# Patient Record
Sex: Male | Born: 1975 | Race: Black or African American | Hispanic: No | Marital: Married | Smoking: Current every day smoker
Health system: Southern US, Community
[De-identification: ages and names within clinical notes are randomized; demographics above are authoritative.]

---

## 2015-04-13 ENCOUNTER — Emergency Department: Payer: Self-pay

## 2015-04-13 ENCOUNTER — Emergency Department
Admission: EM | Admit: 2015-04-13 | Discharge: 2015-04-13 | Disposition: A | Discharge status: Home / Self Care | Payer: Self-pay | Attending: Emergency Medicine | Admitting: Emergency Medicine

## 2015-04-13 DIAGNOSIS — R911 Solitary pulmonary nodule: Secondary | ICD-10-CM | POA: Insufficient documentation

## 2015-04-13 DIAGNOSIS — M7918 Myalgia, other site: Secondary | ICD-10-CM

## 2015-04-13 DIAGNOSIS — M791 Myalgia: Secondary | ICD-10-CM | POA: Insufficient documentation

## 2015-04-13 DIAGNOSIS — R0789 Other chest pain: Secondary | ICD-10-CM | POA: Insufficient documentation

## 2015-04-13 DIAGNOSIS — Z720 Tobacco use: Secondary | ICD-10-CM | POA: Insufficient documentation

## 2015-04-13 MED ORDER — HYDROCODONE-ACETAMINOPHEN 5-325 MG PO TABS
ORAL_TABLET | ORAL | Status: AC
  Filled 2015-04-13: qty 1

## 2015-04-13 MED ORDER — IPRATROPIUM-ALBUTEROL 0.5-2.5 (3) MG/3ML IN SOLN
RESPIRATORY_TRACT | Status: AC
  Filled 2015-04-13: qty 3

## 2015-04-13 MED ORDER — IPRATROPIUM-ALBUTEROL 0.5-2.5 (3) MG/3ML IN SOLN
3.0000 mL | Freq: Once | RESPIRATORY_TRACT | Status: AC
  Administered 2015-04-13: 3 mL via RESPIRATORY_TRACT

## 2015-04-13 MED ORDER — AZITHROMYCIN 250 MG PO TABS: ORAL_TABLET | ORAL | Status: DC

## 2015-04-13 MED ORDER — HYDROCODONE-ACETAMINOPHEN 5-325 MG PO TABS
1.0000 | ORAL_TABLET | ORAL | Status: DC | PRN
Start: 2015-04-13 — End: 2021-03-19

## 2015-04-13 MED ORDER — ALBUTEROL SULFATE HFA 108 (90 BASE) MCG/ACT IN AERS: 2.0000 | INHALATION_SPRAY | Freq: Four times a day (QID) | RESPIRATORY_TRACT | Status: DC | PRN

## 2015-04-13 MED ORDER — HYDROCODONE-ACETAMINOPHEN 5-325 MG PO TABS
2.0000 | ORAL_TABLET | Freq: Once | ORAL | Status: AC
  Administered 2015-04-13: 2 via ORAL

## 2015-04-13 NOTE — ED Notes (Addendum)
Pt states that he has been having pain in his back on the right side x 2 days. Pt points to right rib cage area but states that pain is in his back. Pt states that he is a smoker and denies cough, congestion. Pt states he can't take a deep breath. Pt has taken some motrin for pain but it helped with pain but still had difficulty breathing. Pt states he may have contracted "some small town virus or pneumonia."

## 2015-04-13 NOTE — ED Provider Notes (Signed)
[facility] Emergency Department Provider Note  ____________________________________________  Time seen:1534 I have reviewed the triage vital signs and the nursing notes.   HISTORY  Chief Complaint Back Pain and Shortness of Breath   HPI Juan Mckenzie is a 39 y.o. male patient is here today with complaint of right-sided back pain. He rates to his right rib cage when he is explaining his pain. He states he is also short of breath due to the pain. This is been going on for 2 days with nonproductive cough at times. He felt like he had flulike symptoms the first of the week and took an Advil and a hot shower which made him feel somewhat better. He denies any fever,  Cough or congestion. States he is a smoker at one pack per day. Denies any asthma or bronchitis in the past. Does not have a PCP is not on any medicines currently. Currently he rates his pain as 10 out of 10   History reviewed. No pertinent past medical history.  There are no active problems to display for this patient.   History reviewed. No pertinent past surgical history.  No current outpatient prescriptions on file.  Allergies Review of patient's allergies indicates no known allergies.  No family history on file.  Social History History  Substance Use Topics  . Smoking status: Current Every Day Smoker -- 1.00 packs/day    Types: Cigarettes  . Smokeless tobacco: Not on file  . Alcohol Use: No    Review of Systems Constitutional: No fever/chills Eyes: No visual changes. ENT: No sore throat. Cardiovascular: Positive posterior chest pain. Respiratory: - Positive shortness of breath. Gastrointestinal: No abdominal pain.  No nausea, no vomiting.  No diarrhea. . Genitourinary: Negative for dysuria. Musculoskeletal: Negative for low back pain. Tender right rhomboid area and scapular area. Skin: Negative for rash. Neurological: Negative for headaches, focal weakness or numbness.  10-point  ROS otherwise negative.  ____________________________________________   PHYSICAL EXAM:  VITAL SIGNS: ED Triage Vitals  Enc Vitals Group     BP 04/13/15 1454 129/88 mmHg     Pulse Rate 04/13/15 1454 85     Resp 04/13/15 1454 18     Temp 04/13/15 1454 98.2 F (36.8 C)     Temp Source 04/13/15 1454 Oral     SpO2 04/13/15 1454 96 %     Weight 04/13/15 1454 210 lb (95.255 kg)     Height 04/13/15 1454  (1.854 m)     Head Cir --      Peak Flow --      Pain Score 04/13/15 1454 10     Pain Loc --      Pain Edu? --      Excl. in GC? --     Constitutional: Alert and oriented. Well appearing and in no acute distress but does appear uncomfortable lying on his stomach and with guarded movements.. Eyes: Conjunctivae are normal. PERRL. EOMI. Head: Atraumatic. Nose: No congestion/rhinnorhea. Mouth/Throat: Mucous membranes are moist.  Oropharynx non-erythematous. Neck: No stridor.   Cardiovascular: Normal rate, regular rhythm. Grossly normal heart sounds.  Good peripheral circulation. Respiratory: Normal respiratory effort.  No retractions. Lungs CTAB. Poor air exchange bilaterally and guarded secondary to rib pain. Moderate tenderness on palpation of the rhomboid muscle right side. Range of motion is restricted secondary to pain Gastrointestinal: Soft and nontender. No distention. Musculoskeletal: No lower extremity tenderness nor edema.  No joint effusions. Neurologic:  Normal speech and language. No gross focal  neurologic deficits are appreciated. Speech is normal. No gait instability. Skin:  Skin is warm, dry and intact. No rash noted. Psychiatric: Mood and affect are normal. Speech and behavior are normal.  ____________________________________________   LABS (all labs ordered are listed, but only abnormal results are displayed)  Labs Reviewed - No data to  display ____________________________________________  EKG  Deferred ____________________________________________  RADIOLOGY  Nodular density noted in the left midlung. 5-10 mm in size. CT without contrast would be helpful to evaluate for neoplasm  CT scan per radiologist's shows a 12.5 mm superior segment left lower lobe nodule with some spiculation.  Consider inflammatory process versus neoplasm. Recommended for patient to have repeat CT in 1 month. __________________________ __________________   PROCEDURES  Procedure(s) performed: None  Critical Care performed: No  ____________________________________________   INITIAL IMPRESSION / ASSESSMENT AND PLAN / ED COURSE  Pertinent labs & imaging results that were available during my care of the patient were reviewed by me and considered in my medical decision making (see chart for details).  Patient was told the results of his CT scan and the need for repeat CT scan in 1 month. He does not have a PCP so that doctor on call for medicine was given to him. He was instructed to call Monday and go ahead and make an appointment. He is also instructed to quit smoking at this time after 20 years of smoking. ____________________________________________   FINAL CLINICAL IMPRESSION(S) / ED DIAGNOSES  Final diagnoses:  Myofacial muscle pain  Acute chest wall pain  Incidental lung nodule      Tommi RumpsRhonda L Summers, PA-C 04/13/15 1901  Minna AntisKevin Paduchowski, MD 04/13/15 [phone]

## 2015-04-13 NOTE — Discharge Instructions (Signed)
Chest Pain (Nonspecific) It is often hard to give a diagnosis for the cause of chest pain. There is always a chance that your pain could be related to something serious, such as a heart attack or a blood clot in the lungs. You need to follow up with your doctor. HOME CARE  If antibiotic medicine was given, take it as directed by your doctor. Finish the medicine even if you start to feel better.  For the next few days, avoid activities that bring on chest pain. Continue physical activities as told by your doctor.  Do not use any tobacco products. This includes cigarettes, chewing tobacco, and e-cigarettes.  Avoid drinking alcohol.  Only take medicine as told by your doctor.  Follow your doctor's suggestions for more testing if your chest pain does not go away.  Keep all doctor visits you made. GET HELP IF:  Your chest pain does not go away, even after treatment.  You have a rash with blisters on your chest.  You have a fever. GET HELP RIGHT AWAY IF:   You have more pain or pain that spreads to your arm, neck, jaw, back, or belly (abdomen).  You have shortness of breath.  You cough more than usual or cough up blood.  You have very bad back or belly pain.  You feel sick to your stomach (nauseous) or throw up (vomit).  You have very bad weakness.  You pass out (faint).  You have chills. This is an emergency. Do not wait to see if the problems will go away. Call your local emergency services (911 in [place] Do not drive yourself to the hospital. MAKE SURE YOU:   Understand these instructions.  Will watch your condition.  Will get help right away if you are not doing well or get worse. Document Released: 04/14/2008 Document Revised: 11/01/2013 Document Reviewed: 04/14/2008 [facility] Patient Information 2015 [place] [place]. This information is not intended to replace advice given to you by your health care provider. Make sure you discuss any questions you have with your  health care provider.     YOU WILL NEED TO STOP SMOKING!    CALL THE DOCTOR ON YOUR PAPERS ON Monday AND TELL THEM THAT YOU WERE SEEN IN THE ER. TAKE Z PACK AS DIRECTED, BEGIN USING INHALER 4 TIMES  A DAY  RETURN TO ER IF ANY SEVERE WORSENING OR URGENT CONCERNS

## 2015-04-13 NOTE — ED Notes (Signed)
Pt c/o upper back pain, worse with deep breathing and movement, "feels like I cant get a full breaths" for the past 2 days.Marland Kitchen.denies any recent illness.the patient is in NAD at present , respirations WNL

## 2016-09-27 ENCOUNTER — Other Ambulatory Visit
Admit: 2016-09-27 | Discharge: 2016-09-27 | Disposition: A | Discharge status: Home / Self Care | Attending: Family Medicine | Admitting: Family Medicine

## 2016-09-27 NOTE — ED Notes (Signed)
Pt brought to ED by [facility] officer  [place]  for blood draw. Pt blood drawn by this RN from the right ac x 1 attempt at 0228 am after cleansing area with provided cleaning pad from blood draw kit. Blood given to [facility] officer [place]  per chain of custody protocol.

## 2016-11-10 ENCOUNTER — Encounter: Payer: Self-pay | Admitting: Emergency Medicine

## 2016-11-10 ENCOUNTER — Emergency Department
Admission: EM | Admit: 2016-11-10 | Discharge: 2016-11-10 | Disposition: A | Discharge status: Home / Self Care | Attending: Emergency Medicine | Admitting: Emergency Medicine

## 2016-11-10 DIAGNOSIS — Z79899 Other long term (current) drug therapy: Secondary | ICD-10-CM | POA: Insufficient documentation

## 2016-11-10 DIAGNOSIS — K0381 Cracked tooth: Secondary | ICD-10-CM | POA: Insufficient documentation

## 2016-11-10 DIAGNOSIS — H9201 Otalgia, right ear: Secondary | ICD-10-CM | POA: Insufficient documentation

## 2016-11-10 DIAGNOSIS — F17210 Nicotine dependence, cigarettes, uncomplicated: Secondary | ICD-10-CM | POA: Insufficient documentation

## 2016-11-10 DIAGNOSIS — K047 Periapical abscess without sinus: Secondary | ICD-10-CM

## 2016-11-10 DIAGNOSIS — F1290 Cannabis use, unspecified, uncomplicated: Secondary | ICD-10-CM | POA: Insufficient documentation

## 2016-11-10 MED ORDER — TRAMADOL HCL 50 MG PO TABS: 50.0000 mg | ORAL_TABLET | Freq: Four times a day (QID) | ORAL | 0 refills | Status: AC | PRN

## 2016-11-10 MED ORDER — LIDOCAINE VISCOUS 2 % MT SOLN
15.0000 mL | Freq: Once | OROMUCOSAL | Status: AC
  Administered 2016-11-10: 15 mL via OROMUCOSAL
  Filled 2016-11-10: qty 15

## 2016-11-10 MED ORDER — IBUPROFEN 600 MG PO TABS: 600.0000 mg | ORAL_TABLET | Freq: Three times a day (TID) | ORAL | 0 refills | Status: DC | PRN

## 2016-11-10 MED ORDER — IBUPROFEN 800 MG PO TABS
800.0000 mg | ORAL_TABLET | Freq: Once | ORAL | Status: AC
  Administered 2016-11-10: 800 mg via ORAL
  Filled 2016-11-10: qty 1

## 2016-11-10 MED ORDER — AMOXICILLIN 500 MG PO CAPS: 500.0000 mg | ORAL_CAPSULE | Freq: Three times a day (TID) | ORAL | 0 refills | Status: DC

## 2016-11-10 MED ORDER — LIDOCAINE VISCOUS 2 % MT SOLN: 5.0000 mL | Freq: Four times a day (QID) | OROMUCOSAL | 0 refills | Status: DC | PRN

## 2016-11-10 NOTE — ED Notes (Signed)
Pt to ed with c/o right sided facial pain and swelling x 2 days.  Pt with c/o +toothache, headache, right sided earache.  Denies difficulty swallowing, denies difficulty breathing.

## 2016-11-10 NOTE — Discharge Instructions (Signed)
Follow-up for nystatin to close this provided: OPTIONS FOR DENTAL FOLLOW UP CARE  [place] Department of Health and Human Services - Local Safety Net Dental Clinics TripDoors.comhttp://www.ncdhhs.gov/dph/oralhealth/services/safetynetclinics.htm   [facility] [phone])  [facility] [phone])  [facility] [phone]  [facility] [phone])  [facility] [phone]) This clinic caters to the indigent population and is on a lottery system. Location: [employer] [address], [address], [place] [facility] Hours: Wednesdays from 6pm - 9pm, patients seen by a lottery system. For dates, call or go to [email or web address] Services: Cleanings, fillings and simple extractions. Payment Options: DENTAL WORK IS FREE OF CHARGE. Bring proof of income or support. Best way to get seen: Arrive at 5:15 pm - this is a lottery, NOT first come/first serve, so arriving earlier will not increase your chances of being seen.     [facility] [phone] Select option 1 for emergencies   Location: [facility], [facility], [address], [place] [facility] Hours: No walk-ins accepted - call the day before to schedule an appointment. Check in times are 9:30 am and 1:30 pm. Services: Simple extractions, temporary fillings, pulpectomy/pulp debridement, uncomplicated abscess drainage. Payment Options: PAYMENT IS DUE AT THE TIME OF SERVICE.  Fee is usually $100-200, additional surgical procedures (e.g. abscess drainage) may be extra. Cash, checks, Visa/MasterCard accepted.  Can file Medicaid if patient is covered for dental - patient should call case worker to check. No discount for [facility] patients. Best way to get seen: MUST call the day before and get onto the schedule. Can usually be seen the next 1-2 days. No walk-ins accepted.      [facility] [phone]   Location: [facility], [address], [place] [facility] Hours: M, W, Th, F 8am or 1:30pm, Tues 9a or 1:30 - first come/first served. Services: Simple extractions, temporary fillings, uncomplicated abscess drainage.  You do not need to be an [facility] [place] [facility] resident. Payment Options: PAYMENT IS DUE AT THE TIME OF SERVICE. Dental insurance, otherwise sliding scale - bring proof of income or support. Depending on income and treatment needed, cost is usually $50-200. Best way to get seen: Arrive early as it is first come/first served.     [facility] [phone]   Location: [address] Clinic Hours: Mon-Thu 8a-5p Services: Most basic dental services including extractions and fillings. Payment Options: PAYMENT IS DUE AT THE TIME OF SERVICE. Sliding scale, up to 50% off - bring proof if income or support. Medicaid with dental option accepted. Best way to get seen: Call to schedule an appointment, can usually be seen within 2 weeks OR they will try to see walk-ins - show up at 8a or 2p (you may have to wait).     [facility] [phone] [place] RESIDENTS ONLY   Location: [facility], [address], [place], [place] [address] Clinic Hours: By appointment only. Monday - Thursday 8am-5pm, Friday 8am-12pm Services: Cleanings, fillings, extractions. Payment Options: PAYMENT IS DUE AT THE TIME OF SERVICE. Cash, Visa or MasterCard. Sliding scale - $30 minimum per service. Best way to get seen: Come in to office, complete packet and make an appointment - need proof of income or support monies for each household member and proof of [facility] [place] [facility] [place] residence. Usually takes about a month to get in.     [facility]  [phone] °  °Location: °[address] [place] °Clinic Hours: Walk-in Urgent Care  Dental Services are offered Monday-Friday mornings only. °The numbers of emergencies accepted daily is limited to the number of °providers available. °Maximum 15 - Mondays, Wednesdays & Thursdays °Maximum 10 - Tuesdays & Fridays °Services: °You do not need to be a [place] resident to be seen for a dental emergency. °Emergencies are defined as pain, swelling, abnormal bleeding, or dental trauma. Walkins will receive x-rays if needed. °NOTE: Dental cleaning is not an emergency. °Payment Options: °PAYMENT IS DUE AT THE TIME OF SERVICE. °Minimum co-pay is $40.00 for uninsured patients. °Minimum co-pay is $3.00 for Medicaid with dental coverage. °Dental Insurance is accepted and must be presented at time of visit. °Medicare does not cover dental. °Forms of payment: Cash, credit card, checks. °Best way to get seen: °If not previously registered with the clinic, walk-in dental registration begins at 7:15 am and is on a first come/first serve basis. °If previously registered with the clinic, call to make an appointment. °  °  °[facility] °[phone] °[place] RESIDENTS ONLY °  °Location: °[address], [place], [place] °Clinic Hours: °Mon-Thu 10a-2p °Services: Extractions only! °Payment Options: °FREE (donations accepted) - bring proof of income or support °Best way to get seen: °Call and schedule an appointment OR come at 8am on the 1st Monday of every month (except for holidays) when it is first come/first served. °  °  °[facility] °[phone] °  °Location: °[address],  °Clinic Hours: °Friday mornings °Services, Payment Options, Best way to get seen: °Call for info ° °

## 2016-11-10 NOTE — ED Notes (Signed)
Pt ambulatory into lobby. Pt alert and oriented X4, active, cooperative, pt in NAD. RR even and unlabored, color WNL.

## 2016-11-10 NOTE — ED Provider Notes (Signed)
[facility] Emergency Department Provider Note   ____________________________________________   First MD Initiated Contact with Patient 11/10/16 [phone]     (approximate)  I have reviewed the triage vital signs and the nursing notes.   HISTORY  Chief Complaint Dental Pain    HPI Juan Mckenzie is a 41 y.o. male dental and ear pain for 3 days. Patient stated there is some mild edema to the right lateral maxillary area. Patient denies any fever associated this complaint. Patient rates his pain as a 10 over 10. Patient described a pain as "achy". No palliative measures taken for this complaint. History reviewed. No pertinent past medical history.  There are no active problems to display for this patient.   History reviewed. No pertinent surgical history.  Prior to Admission medications   Medication Sig Start Date End Date Taking? Authorizing Provider  albuterol (PROVENTIL HFA;VENTOLIN HFA) 108 (90 BASE) MCG/ACT inhaler Inhale 2 puffs into the lungs every 6 (six) hours as needed for wheezing or shortness of breath. 04/13/15   Tommi Rumps, PA-C  amoxicillin (AMOXIL) 500 MG capsule Take 1 capsule (500 mg total) by mouth 3 (three) times daily. 11/10/16   Joni Reining, PA-C  azithromycin (ZITHROMAX Z-PAK) 250 MG tablet Take 2 tablets (500 mg) on  Day 1,  followed by 1 tablet (250 mg) once daily on Days 2 through 5. 04/13/15   Tommi Rumps, PA-C  HYDROcodone-acetaminophen (NORCO/VICODIN) 5-325 MG per tablet Take 1 tablet by mouth every 4 (four) hours as needed for moderate pain. 04/13/15   Tommi Rumps, PA-C  ibuprofen (ADVIL,MOTRIN) 600 MG tablet Take 1 tablet (600 mg total) by mouth every 8 (eight) hours as needed. 11/10/16   Joni Reining, PA-C  lidocaine (XYLOCAINE) 2 % solution Use as directed 5 mLs in the mouth or throat every 6 (six) hours as needed for mouth pain. 11/10/16   Joni Reining, PA-C  traMADol (ULTRAM) 50 MG tablet Take 1 tablet (50 mg total) by  mouth every 6 (six) hours as needed. 11/10/16 11/10/17  Joni Reining, PA-C    Allergies Patient has no known allergies.  No family history on file.  Social History Social History  Substance Use Topics  . Smoking status: Current Every Day Smoker    Packs/day: 1.00    Types: Cigarettes  . Smokeless tobacco: Never Used  . Alcohol use No    Review of Systems Constitutional: No fever/chills Eyes: No visual changes. RUE:AVWUJ ear and dental pain Cardiovascular: Denies chest pain. Respiratory: Denies shortness of breath. Gastrointestinal: No abdominal pain.  No nausea, no vomiting.  No diarrhea.  No constipation. Genitourinary: Negative for dysuria. Musculoskeletal: Negative for back pain. Skin: Negative for rash. Neurological: Negative for headaches, focal weakness or numbness.    ____________________________________________   PHYSICAL EXAM:  VITAL SIGNS: ED Triage Vitals   Enc Vitals Group     BP (!) 146/90     Pulse Rate 65     Resp 18     Temp 97.4 F (36.3 C)     Temp src      SpO2 99 %     Weight 205 lb (93 kg)     Height 6\' 1"  (1.854 m)     Head Circumference      Peak Flow      Pain Score 10     Pain Loc      Pain Edu?      Excl. in GC?  Constitutional: Alert and oriented. Well appearing and in no acute distress. Eyes: Conjunctivae are normal. PERRL. EOMI. Head: Atraumatic. Nose: No congestion/rhinnorhea. Mouth/Throat: Mucous membranes are moist.  Oropharynx non-erythematous. Fractured teeth #14. Edematous gingiva. Neck: No stridor.  No cervical spine tenderness to palpation. Hematological/Lymphatic/Immunilogical: No cervical lymphadenopathy. Cardiovascular: Normal rate, regular rhythm. Grossly normal heart sounds.  Good peripheral circulation. Respiratory: Normal respiratory effort.  No retractions. Lungs CTAB. Gastrointestinal: Soft and nontender. No distention. No abdominal bruits. No CVA tenderness. Musculoskeletal: No lower extremity  tenderness nor edema.  No joint effusions. Neurologic:  Normal speech and language. No gross focal neurologic deficits are appreciated. No gait instability. Skin:  Skin is warm, dry and intact. No rash noted. Psychiatric: Mood and affect are normal. Speech and behavior are normal.  ____________________________________________   LABS (all labs ordered are listed, but only abnormal results are displayed)  Labs Reviewed - No data to display ____________________________________________  EKG   ____________________________________________  RADIOLOGY   ____________________________________________   PROCEDURES  Procedure(s) performed: None  Procedures  Critical Care performed: No  ____________________________________________   INITIAL IMPRESSION / ASSESSMENT AND PLAN / ED COURSE  Pertinent labs & imaging results that were available during my care of the patient were reviewed by me and considered in my medical decision making (see chart for details).  Dental pain secondary to abscess and fractured tooth. Patient given discharge Instructions. Patient advised to follow-up with the dentist list provided. Patient given a prescription for amoxicillin, ibuprofen, tramadol, and viscous lidocaine.  Clinical Course      ____________________________________________   FINAL CLINICAL IMPRESSION(S) / ED DIAGNOSES  Final diagnoses:  Dental abscess      NEW MEDICATIONS STARTED DURING THIS VISIT:  New Prescriptions   AMOXICILLIN (AMOXIL) 500 MG CAPSULE    Take 1 capsule (500 mg total) by mouth 3 (three) times daily.   IBUPROFEN (ADVIL,MOTRIN) 600 MG TABLET    Take 1 tablet (600 mg total) by mouth every 8 (eight) hours as needed.   LIDOCAINE (XYLOCAINE) 2 % SOLUTION    Use as directed 5 mLs in the mouth or throat every 6 (six) hours as needed for mouth pain.   TRAMADOL (ULTRAM) 50 MG TABLET    Take 1 tablet (50 mg total) by mouth every 6 (six) hours as needed.     Note:  This  document was prepared using Dragon voice recognition software and may include unintentional dictation errors.    Joni Reiningonald K Sharlie Shreffler, PA-C 11/10/16 [id number]    Nita Sicklearolina Veronese, MD 11/11/16 [phone]

## 2016-11-10 NOTE — ED Triage Notes (Signed)
Right side toothache and earache.  No resp distress.

## 2017-02-16 ENCOUNTER — Emergency Department: Payer: Self-pay

## 2017-02-16 ENCOUNTER — Emergency Department
Admission: EM | Admit: 2017-02-16 | Discharge: 2017-02-16 | Disposition: A | Discharge status: Home / Self Care | Payer: Self-pay | Attending: Emergency Medicine | Admitting: Emergency Medicine

## 2017-02-16 ENCOUNTER — Encounter: Payer: Self-pay | Admitting: Emergency Medicine

## 2017-02-16 DIAGNOSIS — X500XXA Overexertion from strenuous movement or load, initial encounter: Secondary | ICD-10-CM | POA: Insufficient documentation

## 2017-02-16 DIAGNOSIS — Y999 Unspecified external cause status: Secondary | ICD-10-CM | POA: Insufficient documentation

## 2017-02-16 DIAGNOSIS — S46912A Strain of unspecified muscle, fascia and tendon at shoulder and upper arm level, left arm, initial encounter: Secondary | ICD-10-CM

## 2017-02-16 DIAGNOSIS — Y939 Activity, unspecified: Secondary | ICD-10-CM | POA: Insufficient documentation

## 2017-02-16 DIAGNOSIS — Y929 Unspecified place or not applicable: Secondary | ICD-10-CM | POA: Insufficient documentation

## 2017-02-16 DIAGNOSIS — S161XXA Strain of muscle, fascia and tendon at neck level, initial encounter: Secondary | ICD-10-CM

## 2017-02-16 DIAGNOSIS — F17210 Nicotine dependence, cigarettes, uncomplicated: Secondary | ICD-10-CM | POA: Insufficient documentation

## 2017-02-16 MED ORDER — METHOCARBAMOL 500 MG PO TABS: 500.0000 mg | ORAL_TABLET | Freq: Four times a day (QID) | ORAL | 0 refills | Status: DC

## 2017-02-16 MED ORDER — MELOXICAM 15 MG PO TABS: 15.0000 mg | ORAL_TABLET | Freq: Every day | ORAL | 0 refills | Status: DC

## 2017-02-16 NOTE — ED Notes (Signed)
Pt discharged to home.  Family member driving.  Discharge instructions reviewed.  Verbalized understanding.  No questions or concerns at this time.  Teach back verified.  Pt in NAD.  No items left in ED.   

## 2017-02-16 NOTE — ED Notes (Signed)
See triage note. Having pain to left shoulder   States he lifted a heavy chair with 1 arm.. No deformity noted   Positive pulses

## 2017-02-16 NOTE — ED Provider Notes (Signed)
[facility] Emergency Department Provider Note  ____________________________________________  Time seen: Approximately 8:46 PM  I have reviewed the triage vital signs and the nursing notes.   HISTORY  Chief Complaint Shoulder Pain and Neck Pain    HPI Juan Mckenzie is a 41 y.o. male who presents emergency department complaining of left-sided neck and left shoulder pain. Patient reports that he does heavy lifting for strong and feels like he "injured something." Patient denies any direct trauma. He reports that "I think, it is. My shoulder." When asked about specific injury, again patient denies any specific injury. Patient reports that he has full range of motion to her shoulder but it hurts to move. Patient reports that it is a burning/tight/throbbing sensation to the left side and neck as well as left shoulder. Patient reports the pain is more located left anterior aspect of the neck and shoulder. No numbness or tingling bilateral upper extremities. No other complaints at this time.   History reviewed. No pertinent past medical history.  There are no active problems to display for this patient.   History reviewed. No pertinent surgical history.  Prior to Admission medications   Medication Sig Start Date End Date Taking? Authorizing Provider  albuterol (PROVENTIL HFA;VENTOLIN HFA) 108 (90 BASE) MCG/ACT inhaler Inhale 2 puffs into the lungs every 6 (six) hours as needed for wheezing or shortness of breath. 04/13/15   Tommi Rumps, PA-C  amoxicillin (AMOXIL) 500 MG capsule Take 1 capsule (500 mg total) by mouth 3 (three) times daily. 11/10/16   Joni Reining, PA-C  azithromycin (ZITHROMAX Z-PAK) 250 MG tablet Take 2 tablets (500 mg) on  Day 1,  followed by 1 tablet (250 mg) once daily on Days 2 through 5. 04/13/15   Tommi Rumps, PA-C  HYDROcodone-acetaminophen (NORCO/VICODIN) 5-325 MG per tablet Take 1 tablet by mouth every 4 (four) hours as needed for  moderate pain. 04/13/15   Tommi Rumps, PA-C  ibuprofen (ADVIL,MOTRIN) 600 MG tablet Take 1 tablet (600 mg total) by mouth every 8 (eight) hours as needed. 11/10/16   Joni Reining, PA-C  lidocaine (XYLOCAINE) 2 % solution Use as directed 5 mLs in the mouth or throat every 6 (six) hours as needed for mouth pain. 11/10/16   Joni Reining, PA-C  meloxicam (MOBIC) 15 MG tablet Take 1 tablet (15 mg total) by mouth daily. 02/16/17   Delorise Royals Cuthriell, PA-C  methocarbamol (ROBAXIN) 500 MG tablet Take 1 tablet (500 mg total) by mouth 4 (four) times daily. 02/16/17   Delorise Royals Cuthriell, PA-C  traMADol (ULTRAM) 50 MG tablet Take 1 tablet (50 mg total) by mouth every 6 (six) hours as needed. 11/10/16 11/10/17  Joni Reining, PA-C    Allergies Patient has no known allergies.  No family history on file.  Social History Social History  Substance Use Topics  . Smoking status: Current Every Day Smoker    Packs/day: 1.00    Types: Cigarettes  . Smokeless tobacco: Never Used  . Alcohol use No     Review of Systems  Constitutional: No fever/chills Cardiovascular: no chest pain. Respiratory: no cough. No SOB. Gastrointestinal: No abdominal pain.  No nausea, no vomiting.   Musculoskeletal: Positive for left shoulder and left neck pain. Skin: Negative for rash, abrasions, lacerations, ecchymosis. Neurological: Negative for headaches, focal weakness or numbness. 10-point ROS otherwise negative.  ____________________________________________   PHYSICAL EXAM:  VITAL SIGNS: ED Triage Vitals  Enc Vitals Group  BP 02/16/17 1819 (!) 151/90     Pulse Rate 02/16/17 1819 97     Resp 02/16/17 1819 20     Temp 02/16/17 1819 97.7 F (36.5 C)     Temp Source 02/16/17 1819 Oral     SpO2 02/16/17 1819 100 %     Weight 02/16/17 1820 205 lb (93 kg)     Height 02/16/17 1820  (1.854 m)     Head Circumference --      Peak Flow --      Pain Score 02/16/17 1819 10     Pain Loc --      Pain Edu? --       Excl. in GC? --      Constitutional: Alert and oriented. Well appearing and in no acute distress. Eyes: Conjunctivae are normal. PERRL. EOMI. Head: Atraumatic. ENT:      Ears:       Nose: No congestion/rhinnorhea.      Mouth/Throat: Mucous membranes are moist.  Neck: No stridor.  No cervical spine tenderness to palpation. Patient is tender with palpable spasms noted to the sternocleidomastoid muscle. Hematological/Lymphatic/Immunilogical: No cervical lymphadenopathy. Cardiovascular: Normal rate, regular rhythm. Normal S1 and S2.  Good peripheral circulation. Respiratory: Normal respiratory effort without tachypnea or retractions. Lungs CTAB. Good air entry to the bases with no decreased or absent breath sounds. Musculoskeletal: Full range of motion to all extremities. No gross deformities appreciated. No deformities to the shoulder but inspection. Full range of motion with coaxing. Patient is moderately tender to palpation along the musculature of the superior shoulder. No specific point tenderness. No palpable abdominal night. Patient is nontender palpation over the scapula, clavicle, proximal humerus. No palpable abnormality. Radial pulse intact distally. Sensation intact 5 digits. Neurologic:  Normal speech and language. No gross focal neurologic deficits are appreciated.  Skin:  Skin is warm, dry and intact. No rash noted. Psychiatric: Mood and affect are normal. Speech and behavior are normal. Patient exhibits appropriate insight and judgement.   ____________________________________________   LABS (all labs ordered are listed, but only abnormal results are displayed)  Labs Reviewed - No data to display ____________________________________________  EKG   ____________________________________________  RADIOLOGY Festus Barren Cuthriell, personally viewed and evaluated these images (plain radiographs) as part of my medical decision making, as well as reviewing the written  report by the radiologist.  Dg Shoulder Left  Result Date: 02/16/2017 CLINICAL DATA:  LEFT shoulder and neck pain, lifting injury. EXAM: LEFT SHOULDER - 2+ VIEW COMPARISON:  None. FINDINGS: The humeral head is well-formed and located. Os acromiale. The subacromial, glenohumeral and acromioclavicular joint spaces are intact. No destructive bony lesions. Soft tissue planes are non-suspicious. IMPRESSION: Negative. Electronically Signed   By: Awilda Metro M.D.   On: 02/16/2017 18:50    ____________________________________________    PROCEDURES  Procedure(s) performed:    Procedures    Medications - No data to display   ____________________________________________   INITIAL IMPRESSION / ASSESSMENT AND PLAN / ED COURSE  Pertinent labs & imaging results that were available during my care of the patient were reviewed by me and considered in my medical decision making (see chart for details).  Review of the [place] CSRS was performed in accordance of the [facility] prior to dispensing any controlled drugs.     Patient's diagnosis is consistent with left shoulder strain and left sided sternocleidomastoid muscle spasm. X-ray revealed no acute osseous abnormality. Exam is reassuring. No indication for further imaging.. Patient will be  discharged home with prescriptions for anti-inflammatory muscle relaxer. Patient is to follow up with primary care or orthopedics as needed or otherwise directed. Patient is given ED precautions to return to the ED for any worsening or new symptoms.     ____________________________________________  FINAL CLINICAL IMPRESSION(S) / ED DIAGNOSES  Final diagnoses:  Strain of sternocleidomastoid muscle, initial encounter  Strain of left shoulder, initial encounter      NEW MEDICATIONS STARTED DURING THIS VISIT:  Discharge Medication List as of 02/16/2017  8:48 PM    START taking these medications   Details  meloxicam (MOBIC) 15 MG tablet Take 1 tablet (15  mg total) by mouth daily., Starting Mon 02/16/2017, Print    methocarbamol (ROBAXIN) 500 MG tablet Take 1 tablet (500 mg total) by mouth 4 (four) times daily., Starting Mon 02/16/2017, Print            This chart was dictated using voice recognition software/Dragon. Despite best efforts to proofread, errors can occur which can change the meaning. Any change was purely unintentional.    Racheal Patches, PA-C 02/16/17 2209    Minna Antis, MD 02/16/17 [phone]

## 2017-02-16 NOTE — ED Triage Notes (Signed)
Patient presents to the ED with left shoulder pain and neck pain that began yesterday after lifting many heavy things.  Patient states, "I lifted this heavy chair with one arm like the hulk."  Patient states left shoulder is very tender and pain radiates into his left neck.  Patient states, "I'm worried that I popped it out of socket or something."

## 2017-10-06 ENCOUNTER — Emergency Department (HOSPITAL_COMMUNITY): Payer: No Typology Code available for payment source

## 2017-10-06 ENCOUNTER — Encounter (HOSPITAL_COMMUNITY): Payer: Self-pay | Admitting: Emergency Medicine

## 2017-10-06 ENCOUNTER — Emergency Department (HOSPITAL_COMMUNITY)
Admission: EM | Admit: 2017-10-06 | Discharge: 2017-10-06 | Disposition: A | Discharge status: Home / Self Care | Payer: No Typology Code available for payment source | Attending: Emergency Medicine | Admitting: Emergency Medicine

## 2017-10-06 DIAGNOSIS — Y939 Activity, unspecified: Secondary | ICD-10-CM | POA: Insufficient documentation

## 2017-10-06 DIAGNOSIS — Y999 Unspecified external cause status: Secondary | ICD-10-CM | POA: Insufficient documentation

## 2017-10-06 DIAGNOSIS — Y929 Unspecified place or not applicable: Secondary | ICD-10-CM | POA: Diagnosis not present

## 2017-10-06 DIAGNOSIS — M542 Cervicalgia: Secondary | ICD-10-CM | POA: Insufficient documentation

## 2017-10-06 DIAGNOSIS — S060X1A Concussion with loss of consciousness of 30 minutes or less, initial encounter: Secondary | ICD-10-CM | POA: Diagnosis not present

## 2017-10-06 DIAGNOSIS — Z79899 Other long term (current) drug therapy: Secondary | ICD-10-CM | POA: Insufficient documentation

## 2017-10-06 DIAGNOSIS — R1084 Generalized abdominal pain: Secondary | ICD-10-CM | POA: Diagnosis not present

## 2017-10-06 DIAGNOSIS — M25511 Pain in right shoulder: Secondary | ICD-10-CM | POA: Insufficient documentation

## 2017-10-06 DIAGNOSIS — F17210 Nicotine dependence, cigarettes, uncomplicated: Secondary | ICD-10-CM | POA: Insufficient documentation

## 2017-10-06 DIAGNOSIS — S0990XA Unspecified injury of head, initial encounter: Secondary | ICD-10-CM | POA: Diagnosis present

## 2017-10-06 LAB — I-STAT CHEM 8, ED
BUN: 18 mg/dL (ref 6–20)
Calcium, Ion: 1.15 mmol/L (ref 1.15–1.40)
Chloride: 106 mmol/L (ref 101–111)
Creatinine, Ser: 1 mg/dL (ref 0.61–1.24)
Glucose, Bld: 96 mg/dL (ref 65–99)
HEMATOCRIT: 50 % (ref 39.0–52.0)
HEMOGLOBIN: 17 g/dL (ref 13.0–17.0)
Potassium: 3.6 mmol/L (ref 3.5–5.1)
Sodium: 142 mmol/L (ref 135–145)
TCO2: 28 mmol/L (ref 22–32)

## 2017-10-06 MED ORDER — IOPAMIDOL (ISOVUE-300) INJECTION 61%
INTRAVENOUS | Status: AC
  Administered 2017-10-06: 100 mL via INTRAVENOUS
  Filled 2017-10-06: qty 100

## 2017-10-06 MED ORDER — ACETAMINOPHEN 325 MG PO TABS
650.0000 mg | ORAL_TABLET | Freq: Once | ORAL | Status: AC
Start: 2017-10-06 — End: 2017-10-06
  Administered 2017-10-06: 650 mg via ORAL
  Filled 2017-10-06: qty 2
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
## 2017-10-06 NOTE — ED Notes (Signed)
Bed: [id number] Expected date:  Expected time:  Means of arrival:  Comments: Held for TR 6

## 2017-10-06 NOTE — Discharge Instructions (Addendum)
Use tylenol for pain. [facility] for [facility]
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
## 2021-03-19 ENCOUNTER — Encounter (HOSPITAL_BASED_OUTPATIENT_CLINIC_OR_DEPARTMENT_OTHER): Payer: Self-pay | Admitting: Emergency Medicine

## 2021-03-19 ENCOUNTER — Other Ambulatory Visit: Payer: Self-pay

## 2021-03-19 ENCOUNTER — Emergency Department (HOSPITAL_BASED_OUTPATIENT_CLINIC_OR_DEPARTMENT_OTHER): Payer: Self-pay

## 2021-03-19 ENCOUNTER — Emergency Department (HOSPITAL_BASED_OUTPATIENT_CLINIC_OR_DEPARTMENT_OTHER)
Admission: EM | Admit: 2021-03-19 | Discharge: 2021-03-19 | Disposition: A | Discharge status: Home / Self Care | Payer: Self-pay | Attending: Emergency Medicine | Admitting: Emergency Medicine

## 2021-03-19 DIAGNOSIS — R1030 Lower abdominal pain, unspecified: Secondary | ICD-10-CM | POA: Insufficient documentation

## 2021-03-19 DIAGNOSIS — L0231 Cutaneous abscess of buttock: Secondary | ICD-10-CM | POA: Insufficient documentation

## 2021-03-19 DIAGNOSIS — F17210 Nicotine dependence, cigarettes, uncomplicated: Secondary | ICD-10-CM | POA: Insufficient documentation

## 2021-03-19 LAB — COMPREHENSIVE METABOLIC PANEL
ALT: 42 U/L (ref 0–44)
AST: 28 U/L (ref 15–41)
Albumin: 4.4 g/dL (ref 3.5–5.0)
Alkaline Phosphatase: 93 U/L (ref 38–126)
Anion gap: 10 (ref 5–15)
BUN: 20 mg/dL (ref 6–20)
CO2: 25 mmol/L (ref 22–32)
Calcium: 9.2 mg/dL (ref 8.9–10.3)
Chloride: 105 mmol/L (ref 98–111)
Creatinine, Ser: 1.09 mg/dL (ref 0.61–1.24)
GFR, Estimated: >60 mL/min (ref >60)
Glucose, Bld: 119 mg/dL — ABNORMAL HIGH (ref 70–99)
Potassium: 3.7 mmol/L (ref 3.5–5.1)
Sodium: 140 mmol/L (ref 135–145)
Total Bilirubin: 0.4 mg/dL (ref 0.3–1.2)
Total Protein: 7.2 g/dL (ref 6.5–8.1)

## 2021-03-19 LAB — CBC WITH DIFFERENTIAL/PLATELET
Abs Immature Granulocytes: 0.06 10*3/uL (ref 0.00–0.07)
Basophils Absolute: 0.1 10*3/uL (ref 0.0–0.1)
Basophils Relative: 1 %
Eosinophils Absolute: 0.3 10*3/uL (ref 0.0–0.5)
Eosinophils Relative: 2 %
HCT: 47 % (ref 39.0–52.0)
Hemoglobin: 16 g/dL (ref 13.0–17.0)
Immature Granulocytes: 0 %
Lymphocytes Relative: 14 %
Lymphs Abs: 2 10*3/uL (ref 0.7–4.0)
MCH: 30.9 pg (ref 26.0–34.0)
MCHC: 34 g/dL (ref 30.0–36.0)
MCV: 90.9 fL (ref 80.0–100.0)
Monocytes Absolute: 1 10*3/uL (ref 0.1–1.0)
Monocytes Relative: 8 %
Neutro Abs: 10.4 10*3/uL — ABNORMAL HIGH (ref 1.7–7.7)
Neutrophils Relative %: 75 %
Platelets: 211 10*3/uL (ref 150–400)
RBC: 5.17 MIL/uL (ref 4.22–5.81)
RDW: 13.8 % (ref 11.5–15.5)
WBC: 13.8 10*3/uL — ABNORMAL HIGH (ref 4.0–10.5)
nRBC: 0 % (ref 0.0–0.2)

## 2021-03-19 LAB — URINALYSIS, ROUTINE W REFLEX MICROSCOPIC
Bilirubin Urine: NEGATIVE
Glucose, UA: NEGATIVE mg/dL
Hgb urine dipstick: NEGATIVE
Ketones, ur: NEGATIVE mg/dL
Leukocytes,Ua: NEGATIVE
Nitrite: NEGATIVE
Protein, ur: NEGATIVE mg/dL
Specific Gravity, Urine: 1.035 — ABNORMAL HIGH (ref 1.005–1.030)
pH: 5.5 (ref 5.0–8.0)

## 2021-03-19 LAB — LIPASE, BLOOD: Lipase: 27 U/L (ref 11–51)

## 2021-03-19 MED ORDER — MORPHINE SULFATE (PF) 4 MG/ML IV SOLN
4.0000 mg | Freq: Once | INTRAVENOUS | Status: AC
  Administered 2021-03-19: 4 mg via INTRAVENOUS
  Filled 2021-03-19: qty 1

## 2021-03-19 MED ORDER — SODIUM CHLORIDE 0.9 % IV SOLN: INTRAVENOUS | Status: DC

## 2021-03-19 MED ORDER — AMOXICILLIN-POT CLAVULANATE 875-125 MG PO TABS: 1.0000 | ORAL_TABLET | Freq: Two times a day (BID) | ORAL | 0 refills | Status: DC

## 2021-03-19 MED ORDER — IOHEXOL 300 MG/ML  SOLN
100.0000 mL | Freq: Once | INTRAMUSCULAR | Status: AC | PRN
  Administered 2021-03-19: 100 mL via INTRAVENOUS

## 2021-03-19 MED ORDER — ONDANSETRON HCL 4 MG/2ML IJ SOLN
4.0000 mg | Freq: Once | INTRAMUSCULAR | Status: AC
  Administered 2021-03-19: 4 mg via INTRAVENOUS
  Filled 2021-03-19: qty 2

## 2021-03-19 MED ORDER — NAPROXEN 500 MG PO TABS: 500.0000 mg | ORAL_TABLET | Freq: Two times a day (BID) | ORAL | 0 refills | Status: DC

## 2021-03-19 MED ORDER — SODIUM CHLORIDE 0.9 % IV BOLUS
1000.0000 mL | Freq: Once | INTRAVENOUS | Status: AC
  Administered 2021-03-19: 1000 mL via INTRAVENOUS

## 2021-03-19 MED ORDER — LIDOCAINE-EPINEPHRINE (PF) 2 %-1:200000 IJ SOLN
20.0000 mL | Freq: Once | INTRAMUSCULAR | Status: AC
  Administered 2021-03-19: 20 mL
  Filled 2021-03-19: qty 20

## 2021-03-19 NOTE — ED Provider Notes (Signed)
[facility] EMERGENCY DEPT Provider Note   CSN: [id number] Arrival date & time: 03/19/21  1911     History Chief Complaint  Patient presents with  . Abdominal Pain    Juan Mckenzie is a 45 y.o. male.  HPI   Patient presents to the emergency room for evaluation of abdominal and rectal pain.  Patient states he noticed discomfort in his perianal area sometime last week.  Swelling was primarily in right buttock.  Patient however was moving heavy object this week about 400 pounds.  He was pushing and straining.  Since that time he has had increasing pain in that area.  He is also having pain and discomfort in his lower abdomen.  Patient feels like something may have ruptured inside.  He denies any fevers or chills.  No vomiting.  History reviewed. No pertinent past medical history.  There are no problems to display for this patient.   History reviewed. No pertinent surgical history.     History reviewed. No pertinent family history.  Social History   Tobacco Use  . Smoking status: Current Every Day Smoker    Packs/day: 1.00    Types: Cigarettes  . Smokeless tobacco: Never Used  Substance Use Topics  . Alcohol use: No  . Drug use: Yes    Types: Marijuana    Home Medications Prior to Admission medications   Medication Sig Start Date End Date Taking? Authorizing Provider  amoxicillin-clavulanate (AUGMENTIN) 875-125 MG tablet Take 1 tablet by mouth 2 (two) times daily. 03/19/21  Yes Linwood Dibbles, MD  naproxen (NAPROSYN) 500 MG tablet Take 1 tablet (500 mg total) by mouth 2 (two) times daily with a meal. As needed for pain 03/19/21  Yes Linwood Dibbles, MD  methocarbamol (ROBAXIN) 500 MG tablet Take 1 tablet (500 mg total) by mouth 4 (four) times daily. Patient not taking: Reported on 10/06/2017 02/16/17   Cuthriell, Delorise Royals, PA-C  albuterol (PROVENTIL HFA;VENTOLIN HFA) 108 (90 BASE) MCG/ACT inhaler Inhale 2 puffs into the lungs every 6 (six) hours as needed for wheezing  or shortness of breath. Patient not taking: Reported on 10/06/2017 04/13/15 03/19/21  Tommi Rumps, PA-C    Allergies    Patient has no known allergies.  Review of Systems   Review of Systems  All other systems reviewed and are negative.   Physical Exam Updated Vital Signs BP 138/86 (BP Location: Right Arm)   Pulse 84   Temp 98.1 F (36.7 C) (Oral)   Resp 17   Ht 1.854 m (6\' 1" )   Wt 102.1 kg   SpO2 93%   BMI 29.69 kg/m   Physical Exam Vitals and nursing note reviewed.  Constitutional:      General: He is not in acute distress.    Appearance: He is well-developed.  HENT:     Head: Normocephalic and atraumatic.     Right Ear: External ear normal.     Left Ear: External ear normal.  Eyes:     General: No scleral icterus.       Right eye: No discharge.        Left eye: No discharge.     Conjunctiva/sclera: Conjunctivae normal.  Neck:     Trachea: No tracheal deviation.  Cardiovascular:     Rate and Rhythm: Normal rate and regular rhythm.  Pulmonary:     Effort: Pulmonary effort is normal. No respiratory distress.     Breath sounds: Normal breath sounds. No stridor. No wheezing or rales.  Abdominal:     General: Bowel sounds are normal. There is no distension.     Palpations: Abdomen is soft.     Tenderness: There is abdominal tenderness in the suprapubic area and left lower quadrant. There is no guarding or rebound.     Comments: No inguinal hernia appreciated, no testicular mass or tenderness  Genitourinary:    Comments: Area of induration and a small area of purulent drainage arising from the left buttock, evidence of old scar tissue in that region Musculoskeletal:        General: No tenderness.     Cervical back: Neck supple.  Skin:    General: Skin is warm and dry.     Findings: No rash.  Neurological:     Mental Status: He is alert.     Cranial Nerves: No cranial nerve deficit (no facial droop, extraocular movements intact, no slurred speech).      Sensory: No sensory deficit.     Motor: No abnormal muscle tone or seizure activity.     Coordination: Coordination normal.     ED Results / Procedures / Treatments   Labs (all labs ordered are listed, but only abnormal results are displayed) Labs Reviewed  COMPREHENSIVE METABOLIC PANEL - Abnormal; Notable for the following components:      Result Value   Glucose, Bld 119 (*)    All other components within normal limits  CBC WITH DIFFERENTIAL/PLATELET - Abnormal; Notable for the following components:   WBC 13.8 (*)    Neutro Abs 10.4 (*)    All other components within normal limits  URINALYSIS, ROUTINE W REFLEX MICROSCOPIC - Abnormal; Notable for the following components:   Specific Gravity, Urine 1.035 (*)    All other components within normal limits  LIPASE, BLOOD    EKG None  Radiology CT ABDOMEN PELVIS W CONTRAST  Result Date: 03/19/2021 CLINICAL DATA:  Lower abdominal pain. Suprapubic pain. Rectal pain. EXAM: CT ABDOMEN AND PELVIS WITH CONTRAST TECHNIQUE: Multidetector CT imaging of the abdomen and pelvis was performed using the standard protocol following bolus administration of intravenous contrast. CONTRAST:  OMNIPAQUE IOHEXOL 300 MG/ML  SOLN COMPARISON:  10/06/2017 FINDINGS: Lower chest: Unremarkable. Hepatobiliary: No suspicious focal abnormality within the liver parenchyma. Gallbladder decompressed. No intrahepatic or extrahepatic biliary dilation. Pancreas: No focal mass lesion. No dilatation of the main duct. No intraparenchymal cyst. No peripancreatic edema. Spleen: No splenomegaly. No focal mass lesion. Adrenals/Urinary Tract: No adrenal nodule or mass. Kidneys unremarkable. No evidence for hydroureter. The urinary bladder appears normal for the degree of distention. Stomach/Bowel: Stomach is unremarkable. No gastric wall thickening. No evidence of outlet obstruction. Duodenum is normally positioned as is the ligament of Treitz. No small bowel wall thickening. No  small bowel dilatation. The terminal ileum is normal. The appendix is normal. No gross colonic mass. No colonic wall thickening. Linear band of soft tissue attenuation tracks from the region of the lower rectum/anus through the subcutaneous fat deep to the left gluteal fold to a position along the posterior medial buttocks region. Caudal extent of this abnormality has been incompletely visualized. Although incompletely visualized on the prior study, this finding was present previously. Vascular/Lymphatic: No abdominal aortic aneurysm. There is no gastrohepatic or hepatoduodenal ligament lymphadenopathy. No retroperitoneal or mesenteric lymphadenopathy. No pelvic sidewall lymphadenopathy. Reproductive: The prostate gland and seminal vesicles are unremarkable. Other: No intraperitoneal free fluid. Musculoskeletal: No worrisome lytic or sclerotic osseous abnormality. IMPRESSION: 1. No acute findings in the abdomen or pelvis. Specifically, no  findings to explain the patient's history of lower abdominal pain. 2. Linear band of soft tissue attenuation tracks from the region of the lower rectum/anus through the subcutaneous fat deep to the left gluteal fold tothe skin of the medial left buttocks region. Caudal extent of this abnormality has been incompletely visualized. Although incompletely visualized on the prior study, this finding was present previously and is similar along its visualized portion. CT imaging features to raise the question of perirectal fistula although this may represent scar as there is not a substantial amount of edema/inflammation associated. No evidence for drainable perirectal abscess. Electronically Signed   By: Kennith CenterEric  Mansell M.D.   On: 03/19/2021 21:58    Procedures .Marland Kitchen.Incision and Drainage  Date/Time: 03/19/2021 10:01 PM Performed by: Linwood DibblesKnapp, Jefte Carithers, MD Authorized by: Linwood DibblesKnapp, Angely Dietz, MD   Consent:    Consent obtained:  Verbal   Consent given by:  Patient   Risks discussed:  Bleeding,  incomplete drainage, pain and damage to other organs   Alternatives discussed:  No treatment Universal protocol:    Procedure explained and questions answered to patient or proxy's satisfaction: yes     Relevant documents present and verified: yes     Test results available : yes     Imaging studies available: yes     Required blood products, implants, devices, and special equipment available: yes     Site/side marked: yes     Immediately prior to procedure, a time out was called: yes     Patient identity confirmed:  Verbally with patient Location:    Type:  Abscess   Location: Left buttock overlying ischial tuberosity. Pre-procedure details:    Skin preparation:  Chlorhexidine Sedation:    Sedation type:  None Anesthesia:    Anesthesia method:  Local infiltration   Local anesthetic:  Lidocaine 1% WITH epi Procedure type:    Complexity:  Complex Procedure details:    Incision types:  Single straight   Incision depth:  Subcutaneous   Wound management:  Probed and deloculated, irrigated with saline and extensive cleaning   Drainage:  Purulent   Drainage amount:  Moderate   Packing materials:  1/2 in gauze Post-procedure details:    Procedure completion:  Tolerated well, no immediate complications     Medications Ordered in ED Medications  sodium chloride 0.9 % bolus 1,000 mL (0 mLs Intravenous Stopped 03/19/21 2205)  ondansetron (ZOFRAN) injection 4 mg (4 mg Intravenous Given 03/19/21 2048)  morphine 4 MG/ML injection 4 mg (4 mg Intravenous Given 03/19/21 2048)  lidocaine-EPINEPHrine (XYLOCAINE W/EPI) 2 %-1:200000 (PF) injection 20 mL (20 mLs Infiltration Given by Other 03/19/21 2218)  iohexol (OMNIPAQUE) 300 MG/ML solution 100 mL (100 mLs Intravenous Contrast Given 03/19/21 2135)    ED Course  I have reviewed the triage vital signs and the nursing notes.  Pertinent labs & imaging results that were available during my care of the patient were reviewed by me and considered in  my medical decision making (see chart for details).  Clinical Course as of 03/21/21 1559  Tue Mar 19, 2021  2232 CT scan does not show any acute abdominal pelvic etiology although there is question of the possibility of perirectal fistula [JK]    Clinical Course User Index [JK] Linwood DibblesKnapp, Doloros Kwolek, MD   MDM Rules/Calculators/A&P                          Pt with complaints of buttock pain.  Pt felt as  if he had a hemorrhoid.  On exam, pt had an abscess.  CT scan without signs of intrabdominal etiology.  ?perirectal fistula.  Will refer to gen surg as outpt.  Non toxix appearing.  Will dc home on oral abx, warm soaks. Final Clinical Impression(s) / ED Diagnoses Final diagnoses:  Left buttock abscess    Rx / DC Orders ED Discharge Orders         Ordered    amoxicillin-clavulanate (AUGMENTIN) 875-125 MG tablet  2 times daily        03/19/21 2257    naproxen (NAPROSYN) 500 MG tablet  2 times daily with meals        03/19/21 2257           Linwood Dibbles, MD 03/21/21 1559

## 2021-03-19 NOTE — Discharge Instructions (Signed)
Follow-up with the surgeons to evaluate for the possibility of the perirectal fistula.  Take the antibiotics as prescribed.  Use the pain medications as needed.  Continue to soak in the tub.  There is a small amount of gauze in the wound.  You can pull that out tomorrow evening after soaking in the tub.  Otherwise return to have that removed in the next couple days if you are unable to do so.

## 2021-03-19 NOTE — ED Triage Notes (Signed)
Pt presents to ED POV. Pt c/o suprapubic abd pain and rectal pain. 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 Pt also c/o large hemorrhoids

## 2021-07-30 IMAGING — CT CT ABD-PELV W/ CM
2 of 5 series · 15 of 46 positions shown, 17 images · IV contrast (APPLIED)
Comparison: 10/06/2017

CLINICAL DATA: Lower abdominal pain. Suprapubic pain. Rectal pain.

EXAM:
CT ABDOMEN AND PELVIS WITH CONTRAST
TECHNIQUE: Multidetector CT imaging of the abdomen and pelvis was performed
using the standard protocol following bolus administration of
intravenous contrast.
CONTRAST:  100mL OMNIPAQUE IOHEXOL 300 MG/ML  SOLN

[Series 2: abd pel w · axial · 0.67mm/px · z∈[-1060,-615]mm · 12 of 101 slices shown, 14 images]
[im 6/101  soft-tissue]
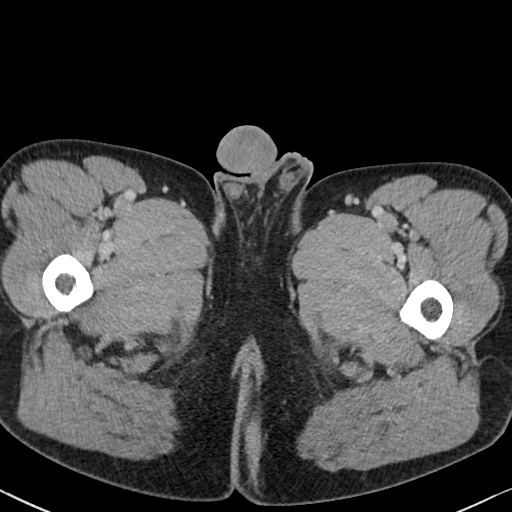
[im 6/101  bone]
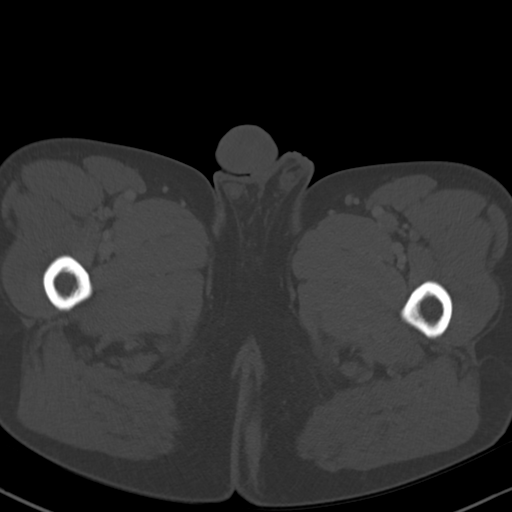
[im 16/101  soft-tissue]
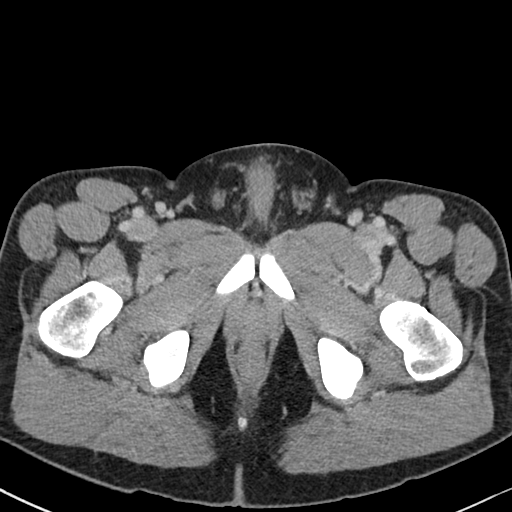
[im 22/101  soft-tissue]
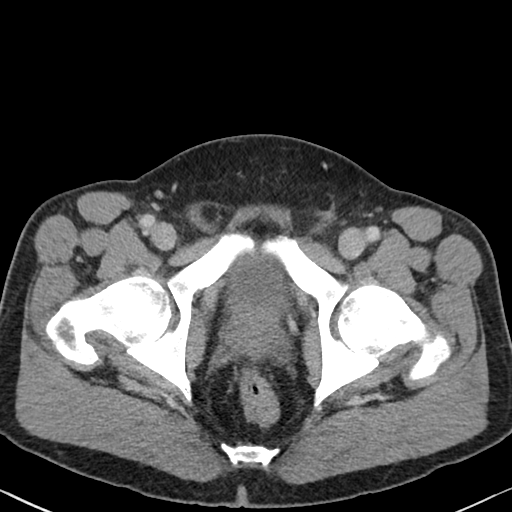
[im 32/101  soft-tissue]
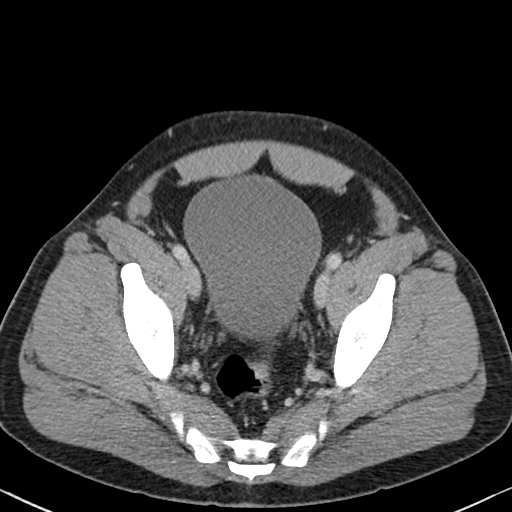
[im 37/101  soft-tissue]
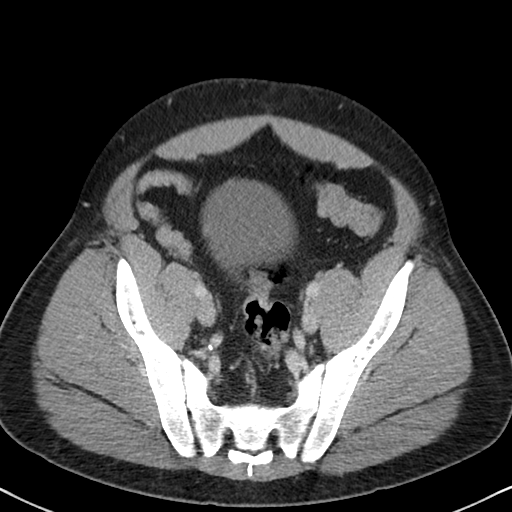
[im 48/101  soft-tissue]
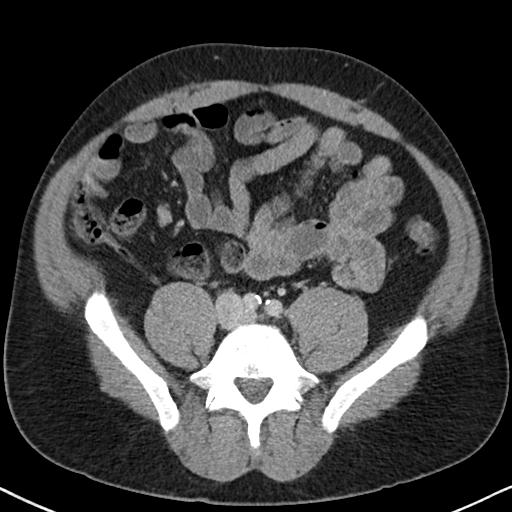
[im 53/101  soft-tissue]
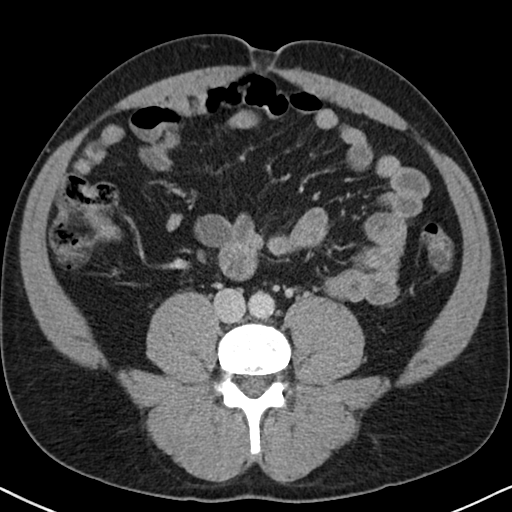
[im 64/101  soft-tissue]
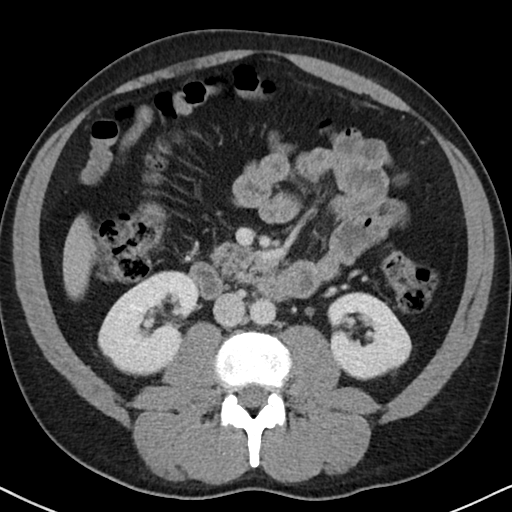
[im 69/101  soft-tissue]
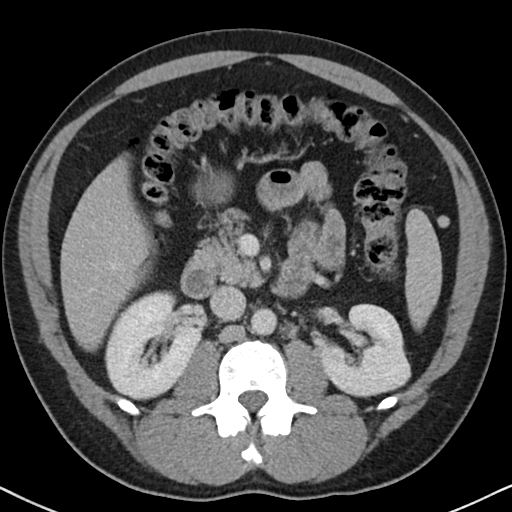
[im 69/101  bone]
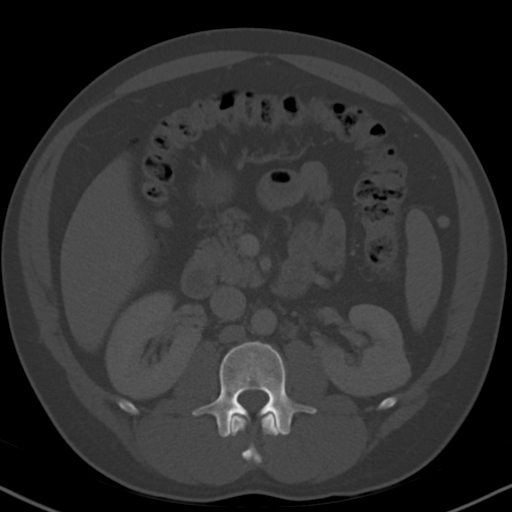
[im 79/101  soft-tissue]
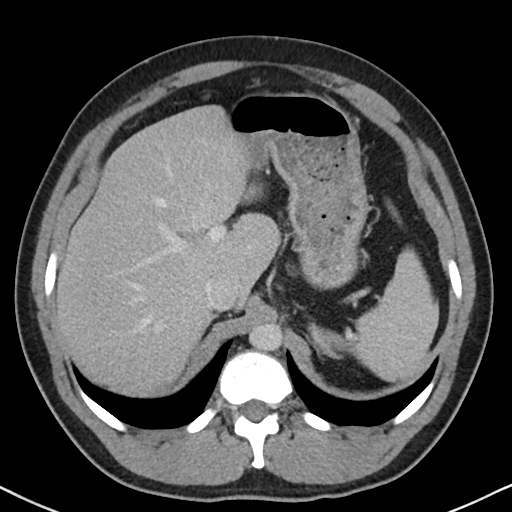
[im 85/101  soft-tissue]
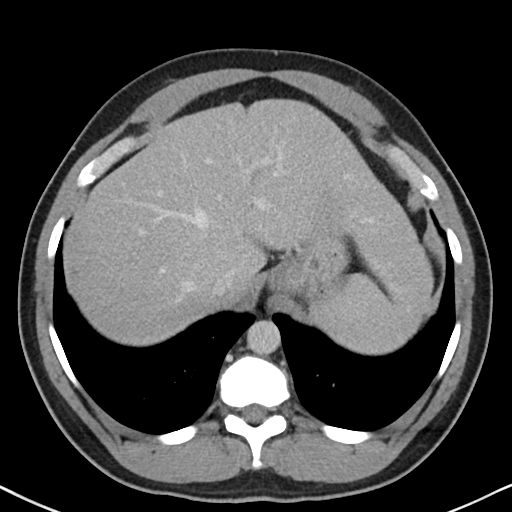
[im 95/101  soft-tissue]
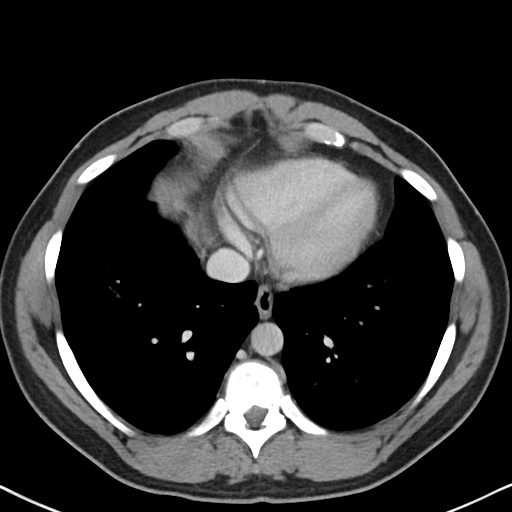

[Series 5: coronal · coronal · 0.69mm/px · 3 of 108 slices shown]
[im 36/108  soft-tissue]
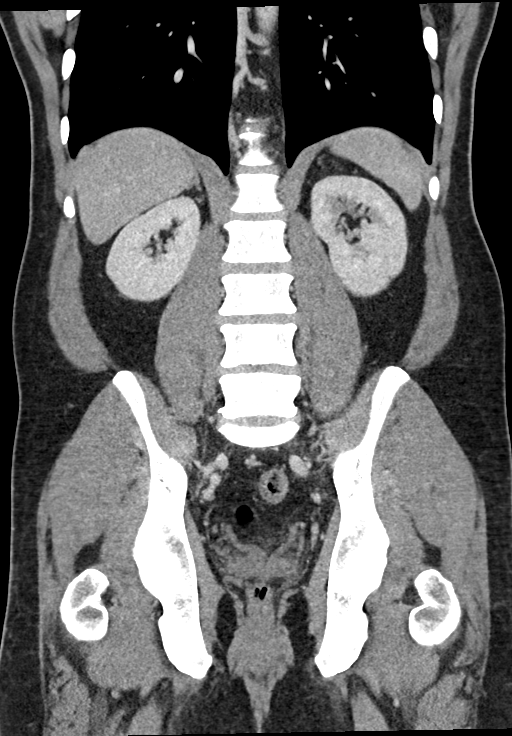
[im 48/108  soft-tissue]
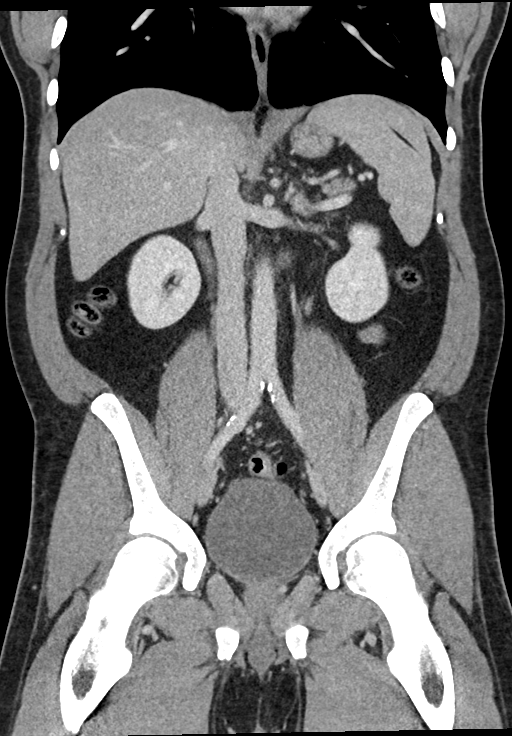
[im 60/108  soft-tissue]
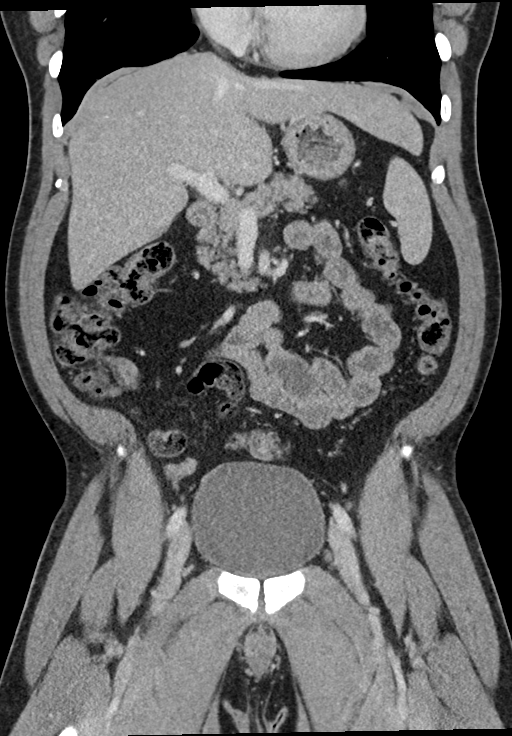

[15 of 46 positions shown; findings below may reference images not displayed]

FINDINGS: Lower chest: Unremarkable.

Hepatobiliary: No suspicious focal abnormality within the liver
parenchyma. Gallbladder decompressed. No intrahepatic or
extrahepatic biliary dilation.

Pancreas: No focal mass lesion. No dilatation of the main duct. No
intraparenchymal cyst. No peripancreatic edema.

Spleen: No splenomegaly. No focal mass lesion.

Adrenals/Urinary Tract: No adrenal nodule or mass. Kidneys
unremarkable. No evidence for hydroureter. The urinary bladder
appears normal for the degree of distention.

Stomach/Bowel: Stomach is unremarkable. No gastric wall thickening.
No evidence of outlet obstruction. Duodenum is normally positioned
as is the ligament of Treitz. No small bowel wall thickening. No
small bowel dilatation. The terminal ileum is normal. The appendix
is normal. No gross colonic mass. No colonic wall thickening. Linear
band of soft tissue attenuation tracks from the region of the lower
rectum/anus through the subcutaneous fat deep to the left gluteal
fold to a position along the posterior medial buttocks region.
Caudal extent of this abnormality has been incompletely visualized.
Although incompletely visualized on the prior study, this finding
was present previously.

Vascular/Lymphatic: No abdominal aortic aneurysm. There is no
gastrohepatic or hepatoduodenal ligament lymphadenopathy. No
retroperitoneal or mesenteric lymphadenopathy. No pelvic sidewall
lymphadenopathy.

Reproductive: The prostate gland and seminal vesicles are
unremarkable.

Other: No intraperitoneal free fluid.

Musculoskeletal: No worrisome lytic or sclerotic osseous
abnormality.
IMPRESSION: 1. No acute findings in the abdomen or pelvis. Specifically, no
findings to explain the patient's history of lower abdominal pain.
2. Linear band of soft tissue attenuation tracks from the region of
the lower rectum/anus through the subcutaneous fat deep to the left
gluteal fold tothe skin of the medial left buttocks region. Caudal
extent of this abnormality has been incompletely visualized.
Although incompletely visualized on the prior study, this finding
was present previously and is similar along its visualized portion.
CT imaging features to raise the question of perirectal fistula
although this may represent scar as there is not a substantial
amount of edema/inflammation associated. No evidence for drainable
perirectal abscess.

## 2022-06-16 ENCOUNTER — Other Ambulatory Visit: Payer: Self-pay

## 2022-06-16 ENCOUNTER — Encounter (HOSPITAL_COMMUNITY): Payer: Self-pay | Admitting: Emergency Medicine

## 2022-06-16 ENCOUNTER — Emergency Department (HOSPITAL_COMMUNITY)
Admission: EM | Admit: 2022-06-16 | Discharge: 2022-06-17 | Disposition: A | Discharge status: Home / Self Care | Payer: BC Managed Care – PPO | Attending: Student | Admitting: Student

## 2022-06-16 ENCOUNTER — Emergency Department (HOSPITAL_COMMUNITY): Payer: BC Managed Care – PPO

## 2022-06-16 DIAGNOSIS — W208XXA Other cause of strike by thrown, projected or falling object, initial encounter: Secondary | ICD-10-CM | POA: Insufficient documentation

## 2022-06-16 DIAGNOSIS — S6992XA Unspecified injury of left wrist, hand and finger(s), initial encounter: Secondary | ICD-10-CM | POA: Diagnosis present

## 2022-06-16 DIAGNOSIS — S61412A Laceration without foreign body of left hand, initial encounter: Secondary | ICD-10-CM | POA: Diagnosis not present

## 2022-06-16 DIAGNOSIS — Y990 Civilian activity done for income or pay: Secondary | ICD-10-CM | POA: Diagnosis not present

## 2022-06-16 DIAGNOSIS — F17210 Nicotine dependence, cigarettes, uncomplicated: Secondary | ICD-10-CM | POA: Insufficient documentation

## 2022-06-16 DIAGNOSIS — R062 Wheezing: Secondary | ICD-10-CM | POA: Insufficient documentation

## 2022-06-16 NOTE — ED Triage Notes (Signed)
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 Patient has swelling and puncture mark to the palm of his left hand.  Patient reports this happened at 1400 and reports this evening, he started having shob and subjective fevers.

## 2022-06-16 NOTE — ED Provider Triage Note (Signed)
Emergency Medicine Provider Triage Evaluation Note  Juan Mckenzie , a 46 y.o. male  was evaluated in triage.  Pt complains of left hand pain and swelling.  Patient states that he was doing tree work today around 3 PM and a limb punctured his left hand.  Patient reports that he pulled a piece of wood out of the area however he feels like there might be something else stuck in the wound.  Patient states that this evening he had some shortness of breath and subjective fevers.  Patient is concerned for infection.  Review of Systems  Positive: Swelling, myalgia, Negative: Numbness, weakness, color change, pallor  Physical Exam  BP (!) 171/112 (BP Location: Right Arm)   Pulse 82   Temp 98.8 F (37.1 C) (Oral)   Resp 18   Ht 6\' 1"  (1.854 m)   Wt 102.1 kg   SpO2 98%   BMI 29.70 kg/m  Gen:   Awake, no distress   Resp:  Normal effort  MSK:   Moves extremities without difficulty  Other:  Small wound to palmar aspect of left hand.  Significant swelling throughout palmar aspect of left hand.  Cap refill less than 2 seconds all digits left hand.  Sensation intact to all digits of left hand.  Medical Decision Making  Medically screening exam initiated at 10:33 PM.  Appropriate orders placed.  Juan Mckenzie was informed that the remainder of the evaluation will be completed by another provider, this initial triage assessment does not replace that evaluation, and the importance of remaining in the ED until their evaluation is complete.     Juan Mckenzie, Juan Mckenzie 06/16/22 2235

## 2022-06-17 MED ORDER — CEPHALEXIN 500 MG PO CAPS
500.0000 mg | ORAL_CAPSULE | Freq: Once | ORAL | Status: AC
  Administered 2022-06-17: 500 mg via ORAL
  Filled 2022-06-17: qty 1

## 2022-06-17 MED ORDER — IBUPROFEN 800 MG PO TABS
800.0000 mg | ORAL_TABLET | Freq: Once | ORAL | Status: AC
  Administered 2022-06-17: 800 mg via ORAL
  Filled 2022-06-17: qty 1

## 2022-06-17 MED ORDER — BACITRACIN ZINC 500 UNIT/GM EX OINT
TOPICAL_OINTMENT | Freq: Two times a day (BID) | CUTANEOUS | Status: DC
  Administered 2022-06-17: 1 via TOPICAL
  Filled 2022-06-17: qty 0.9

## 2022-06-17 MED ORDER — ALBUTEROL SULFATE HFA 108 (90 BASE) MCG/ACT IN AERS
4.0000 | INHALATION_SPRAY | Freq: Once | RESPIRATORY_TRACT | Status: AC
  Administered 2022-06-17: 4 via RESPIRATORY_TRACT
  Filled 2022-06-17: qty 6.7

## 2022-06-17 MED ORDER — CEPHALEXIN 500 MG PO CAPS: 500.0000 mg | ORAL_CAPSULE | Freq: Four times a day (QID) | ORAL | 0 refills | Status: AC

## 2022-06-17 MED ORDER — AEROCHAMBER PLUS FLO-VU MEDIUM MISC
1.0000 | Freq: Once | Status: AC
  Administered 2022-06-17: 1
  Filled 2022-06-17: qty 1

## 2022-06-17 NOTE — Discharge Instructions (Signed)
You are seen here today for your hand injury.  You been started on antibiotics which should take as prescribed the entire course.  Use Tylenol or ibuprofen as needed for your discomfort.  Return to the ER if you develop any redness, swelling, puslike drainage from the area, or any other severe symptom.

## 2022-06-17 NOTE — ED Provider Notes (Signed)
[facility]-EMERGENCY DEPT Provider Note   CSN: [id number] Arrival date & time: 06/16/22  2205     History  Chief Complaint  Patient presents with   Hand Injury    Juan Mckenzie is a 46 y.o. male  who presents with concern for pain and swelling to his left palm that he was working on a tree when a piece of the branch fell and punctured his left palm at the base of his thumb.  Since that time his had some pain.  States he was able to remove the large splinter of wood without difficulty but has had some swelling and redness to the area since that time.  Endorses brief episode of chills this evening, however states he does not feel it is related.  Tetanus vaccination up-to-date as of 3 years ago according to the patient.  No history of immunocompromising disease.  I personally reviewed his medical records recent carry medical diagnoses nor is he any medications daily. Smokes 1 pack cigarettes daily. HPI     Home Medications Prior to Admission medications   Medication Sig Start Date End Date Taking? Authorizing Provider  cephALEXin (KEFLEX) 500 MG capsule Take 1 capsule (500 mg total) by mouth 4 (four) times daily for 5 days. 06/17/22 06/22/22 Yes Charo Philipp R, PA-C  albuterol (PROVENTIL HFA;VENTOLIN HFA) 108 (90 BASE) MCG/ACT inhaler Inhale 2 puffs into the lungs every 6 (six) hours as needed for wheezing or shortness of breath. Patient not taking: Reported on 10/06/2017 04/13/15 03/19/21  Tommi Rumps, PA-C      Allergies    Patient has no known allergies.    Review of Systems   Review of Systems  Skin:  Positive for wound.    Physical Exam Updated Vital Signs BP (!) 145/90   Pulse 68   Temp 97.6 F (36.4 C) (Oral)   Resp 18   Ht 6\' 1"  (1.854 m)   Wt 102.1 kg   SpO2 100%   BMI 29.70 kg/m  Physical Exam Vitals and nursing note reviewed.  Constitutional:      Appearance: He is not ill-appearing or toxic-appearing.  HENT:     Head:  Normocephalic and atraumatic.     Mouth/Throat:     Mouth: Mucous membranes are moist.     Pharynx: No oropharyngeal exudate or posterior oropharyngeal erythema.  Eyes:     General:        Right eye: No discharge.        Left eye: No discharge.     Conjunctiva/sclera: Conjunctivae normal.  Cardiovascular:     Rate and Rhythm: Normal rate and regular rhythm.     Pulses: Normal pulses.  Pulmonary:     Effort: Pulmonary effort is normal. No respiratory distress.     Breath sounds: Examination of the right-middle field reveals wheezing. Examination of the left-middle field reveals wheezing. Examination of the right-lower field reveals wheezing. Examination of the left-lower field reveals wheezing. Wheezing present. No rales.  Abdominal:     General: There is no distension.     Palpations: Abdomen is soft.     Tenderness: There is no abdominal tenderness.  Musculoskeletal:        General: No deformity.       Hands:     Cervical back: Neck supple.     Comments: FROM of 2nd-5th digits of the left hand, soft tissue swelling and decreased adduction of the left thumb secondary to swelling  Skin:  General: Skin is warm and dry.     Capillary Refill: Capillary refill takes less than 2 seconds.     Findings: Wound present.  Neurological:     General: No focal deficit present.     Mental Status: He is alert and oriented to person, place, and time. Mental status is at baseline.  Psychiatric:        Mood and Affect: Mood normal.     ED Results / Procedures / Treatments   Labs (all labs ordered are listed, but only abnormal results are displayed) Labs Reviewed - No data to display  EKG None  Radiology DG Hand Complete Left  Result Date: 06/16/2022 CLINICAL DATA:  Puncture wound EXAM: LEFT HAND - COMPLETE 3+ VIEW COMPARISON:  None Available. FINDINGS: There is no evidence of fracture or dislocation. There is no evidence of arthropathy or other focal bone abnormality. There is a cystic  focus at the middle finger distal phalanx. No foreign body or osseous injury. IMPRESSION: 1. No foreign body or osseous injury. 2. Benign-appearing cystic focus in the distal phalanx of the middle finger, likely an epidermal cyst, possibly related to an old puncture wound Electronically Signed   By: Deatra Robinson M.D.   On: 06/16/2022 23:11    Procedures Procedures    Medications Ordered in ED Medications  cephALEXin (KEFLEX) capsule 500 mg (500 mg Oral Given 06/17/22 0149)  ibuprofen (ADVIL) tablet 800 mg (800 mg Oral Given 06/17/22 0149)  albuterol (VENTOLIN HFA) 108 (90 Base) MCG/ACT inhaler 4 puff (4 puffs Inhalation Given 06/17/22 0151)  AeroChamber Plus Flo-Vu Medium MISC 1 each (1 each Other Given 06/17/22 0151)    ED Course/ Medical Decision Making/ A&P                           Medical Decision Making 46 year old male with tiny laceration to the palm of the left hand with soft tissue swelling after injury while cutting a tree today.  Hypertensive on intake, vitals otherwise normal.  Cardiopulmonary and abdominal exams are benign.  Patient is neurovascular intact in all extremities including all digits of the left hand.  Soft tissue swelling over the volar aspect of the thenar eminence with some erythema but without purulent drainage or fluctuance.  No erythema extending up to the wrist or arm.  Amount and/or Complexity of Data Reviewed Radiology:     Details: Plain film of the left hand negative for acute osseous abnormality or retained foreign body, no soft tissue air.  Risk OTC drugs. Prescription drug management.   Wound did not require repair.  Was irrigated and dressed antibiotic ointment in the emergency department.  Antibiotics administered in ED as well as ibuprofen.  Also given 2 puffs of albuterol for wheezing noted in the emergency department, per patient at his baseline.  Will discharge with antibiotics given swelling noted to the area for treatment of possible early  infection.  No further work-up warranted in the ED at this time.  Juan Mckenzie  voiced understanding of his medical evaluation and treatment plan. Each of their questions answered to their expressed satisfaction.  Return precautions were given.  Patient is well-appearing, stable, and was discharged in good condition..  This chart was dictated using voice recognition software, Dragon. Despite the best efforts of this provider to proofread and correct errors, errors may still occur which can change documentation meaning.    Final Clinical Impression(s) / ED Diagnoses Final diagnoses:  Injury of  finger of left hand, initial encounter    Rx / DC Orders ED Discharge Orders          Ordered    cephALEXin (KEFLEX) 500 MG capsule  4 times daily        06/17/22 0125              Corretta Munce, Eugene Gavia, PA-C 06/17/22 0752    Glendora Score, MD 06/24/22 [phone]

## 2022-09-03 ENCOUNTER — Encounter (HOSPITAL_COMMUNITY): Payer: Self-pay | Admitting: Emergency Medicine

## 2022-09-03 ENCOUNTER — Other Ambulatory Visit: Payer: Self-pay

## 2022-09-03 ENCOUNTER — Emergency Department (HOSPITAL_COMMUNITY): Payer: Self-pay

## 2022-09-03 ENCOUNTER — Emergency Department (HOSPITAL_COMMUNITY)
Admission: EM | Admit: 2022-09-03 | Discharge: 2022-09-03 | Disposition: A | Discharge status: Home / Self Care | Payer: Self-pay | Attending: Emergency Medicine | Admitting: Emergency Medicine

## 2022-09-03 DIAGNOSIS — U071 COVID-19: Secondary | ICD-10-CM | POA: Insufficient documentation

## 2022-09-03 LAB — CBC
HCT: 52.9 % — ABNORMAL HIGH (ref 39.0–52.0)
Hemoglobin: 17.8 g/dL — ABNORMAL HIGH (ref 13.0–17.0)
MCH: 31 pg (ref 26.0–34.0)
MCHC: 33.6 g/dL (ref 30.0–36.0)
MCV: 92 fL (ref 80.0–100.0)
Platelets: 148 10*3/uL — ABNORMAL LOW (ref 150–400)
RBC: 5.75 MIL/uL (ref 4.22–5.81)
RDW: 13.5 % (ref 11.5–15.5)
WBC: 7.2 10*3/uL (ref 4.0–10.5)
nRBC: 0 % (ref 0.0–0.2)

## 2022-09-03 LAB — RESP PANEL BY RT-PCR (FLU A&B, COVID) ARPGX2
Influenza A by PCR: NEGATIVE
Influenza B by PCR: NEGATIVE
SARS Coronavirus 2 by RT PCR: POSITIVE — AB

## 2022-09-03 MED ORDER — ACETAMINOPHEN 500 MG PO TABS
1000.0000 mg | ORAL_TABLET | Freq: Once | ORAL | Status: AC
  Administered 2022-09-03: 1000 mg via ORAL
  Filled 2022-09-03: qty 2

## 2022-09-03 NOTE — ED Provider Notes (Signed)
[facility] EMERGENCY DEPARTMENT Provider Note   CSN: [id number] Arrival date & time: 09/03/22  7619     History  Chief Complaint  Patient presents with   Chest Pain   Back Pain   Headache   Fever   Shortness of Breath    Juan Mckenzie is a 46 y.o. male.  Patient presents to the emergency department complaining of chest pain, shortness of breath, headache, body aches, and chills which been ongoing for 2 to 3 days.  Patient has subjective fevers at home.  He endorses a generalized chest pain without being able to qualify the pain with any descriptive factors.  He also complains of generalized body aches.  Patient denies any known sick contacts.  Patient denies nausea, vomiting, diarrhea, abdominal pain.  Patient with no relevant past medical history  HPI     Home Medications Prior to Admission medications   Medication Sig Start Date End Date Taking? Authorizing Provider  albuterol (PROVENTIL HFA;VENTOLIN HFA) 108 (90 BASE) MCG/ACT inhaler Inhale 2 puffs into the lungs every 6 (six) hours as needed for wheezing or shortness of breath. Patient not taking: Reported on 10/06/2017 04/13/15 03/19/21  Johnn Hai, PA-C      Allergies    Patient has no known allergies.    Review of Systems   Review of Systems  Constitutional:  Positive for fever.  Respiratory:  Positive for shortness of breath.   Cardiovascular:  Positive for chest pain.  Gastrointestinal:  Negative for abdominal pain, nausea and vomiting.  Musculoskeletal:  Positive for back pain and myalgias.  Neurological:  Positive for headaches.    Physical Exam Updated Vital Signs BP 133/88   Pulse 82   Temp 99.3 F (37.4 C) (Oral)   Resp 16   Ht 6\' 1"  (1.854 m)   Wt 99.8 kg   SpO2 98%   BMI 29.03 kg/m  Physical Exam Vitals and nursing note reviewed.  Constitutional:      General: He is not in acute distress.    Appearance: He is well-developed.  HENT:     Head: Normocephalic and  atraumatic.  Eyes:     Conjunctiva/sclera: Conjunctivae normal.  Cardiovascular:     Rate and Rhythm: Normal rate and regular rhythm.     Heart sounds: No murmur heard. Pulmonary:     Effort: Pulmonary effort is normal. No respiratory distress.     Breath sounds: Normal breath sounds.  Chest:     Chest wall: No tenderness.  Abdominal:     Palpations: Abdomen is soft.     Tenderness: There is no abdominal tenderness.  Musculoskeletal:        General: No swelling.     Cervical back: Neck supple.     Right lower leg: No edema.     Left lower leg: No edema.  Skin:    General: Skin is warm and dry.     Capillary Refill: Capillary refill takes less than 2 seconds.  Neurological:     Mental Status: He is alert.  Psychiatric:        Mood and Affect: Mood normal.     ED Results / Procedures / Treatments   Labs (all labs ordered are listed, but only abnormal results are displayed) Labs Reviewed  RESP PANEL BY RT-PCR (FLU A&B, COVID) ARPGX2 - Abnormal; Notable for the following components:      Result Value   SARS Coronavirus 2 by RT PCR POSITIVE (*)    All  other components within normal limits  CBC - Abnormal; Notable for the following components:   Hemoglobin 17.8 (*)    HCT 52.9 (*)    Platelets 148 (*)    All other components within normal limits  BASIC METABOLIC PANEL  TROPONIN I (HIGH SENSITIVITY)  TROPONIN I (HIGH SENSITIVITY)    EKG None  Radiology DG Chest 2 View  Result Date: 09/03/2022 CLINICAL DATA:  Chest pain, shortness of breath EXAM: CHEST - 2 VIEW COMPARISON:  04/13/2015 FINDINGS: The heart size and mediastinal contours are within normal limits. Both lungs are clear. The visualized skeletal structures are unremarkable. IMPRESSION: No active cardiopulmonary disease. Electronically Signed   By: Charlett Nose M.D.   On: 09/03/2022 09:38    Procedures Procedures    Medications Ordered in ED Medications  acetaminophen (TYLENOL) tablet 1,000 mg (1,000 mg  Oral Given 09/03/22 0909)    ED Course/ Medical Decision Making/ A&P                           Medical Decision Making Amount and/or Complexity of Data Reviewed Labs: ordered. Radiology: ordered.  Risk OTC drugs.   This patient presents to the ED for concern of myalgias, fever, shortness of breath, this involves an extensive number of treatment options, and is a complaint that carries with it a high risk of complications and morbidity.  The differential diagnosis includes but is not limited to COVID-19, influenza, pneumonia, and others   Co morbidities that complicate the patient evaluation  None   Additional history obtained:  Additional history obtained from N/A External records from outside source obtained and reviewed including N/A   Lab Tests:  I Ordered, and personally interpreted labs.  The pertinent results include: Positive COVID-19 test, hemoglobin 17.8 (chronically elevated)   Imaging Studies ordered:  I ordered imaging studies including chest x-ray I independently visualized and interpreted imaging which showed no acute disease I agree with the radiologist interpretation   Cardiac Monitoring: / EKG:  The patient was maintained on a cardiac monitor.  I personally viewed and interpreted the cardiac monitored which showed an underlying rhythm of: Sinus rhythm   Problem List / ED Course / Critical interventions / Medication management   I ordered medication including Tylenol for pain Reevaluation of the patient after these medicines showed that the patient improved I have reviewed the patients home medicines and have made adjustments as needed   Social Determinants of Health:  Patient with no primary care provider, no routine health examinations   Test / Admission - Considered:  There is no indication at this time for admission.  The patient has a positive COVID-19 test result.  He has had symptoms for 3 days.  Chest x-ray shows no sign of  pneumonia.  Labs are grossly unremarkable other than a positive COVID-19 result.  Patient has a nonischemic EKG and history not consistent with ACS.  Plan to discharge patient home.  He may continue supportive care at home including over-the-counter medications as needed for fever and pain control.  He has no comorbidities, no plan at this time to prescribe antiviral.  Patient states he would be uninterested.  Follow-up as needed.  Return precautions provided        Final Clinical Impression(s) / ED Diagnoses Final diagnoses:  COVID-19    Rx / DC Orders ED Discharge Orders     None         Darrick Grinder, PA-C  09/03/22 1356    Margarita Grizzle, MD 09/04/22 1336

## 2022-09-03 NOTE — Discharge Instructions (Signed)
You were diagnosed today with COVID-19.  Please use over-the-counter medications as needed such as NyQuil, DayQuil, Tylenol, ibuprofen for fever and pain control.  Please hydrate as you are able.  Eat foods that she find tolerable.  Please rest.  It will take some more time before your body is able to fight off the infection.  If you develop severe shortness of breath, chest pain, or other life-threatening conditions please return to the emergency department.

## 2022-09-03 NOTE — ED Provider Triage Note (Signed)
Emergency Medicine Provider Triage Evaluation Note  Juan Mckenzie , a 46 y.o. male  was evaluated in triage.  Pt complains of chest pain, shortness of breath, headache, body aches, and chills which been ongoing for 2 days.  Patient states he believes he has had high fevers at home, these are subjective.  He complains of chest pain.  When asked to describe that he says "it hurts "and is unable to give descriptive factors.  He feels that his lungs hurt with deep breaths.  He states that his whole body aches including his back and he has had bad headaches.  He also complains of body chills.  He denies any known sick contacts  Review of Systems  Positive: As above Negative: As above  Physical Exam  BP (!) 140/95 (BP Location: Right Arm)   Pulse 88   Temp 99 F (37.2 C) (Oral)   Resp 16   Ht 6\' 1"  (1.854 m)   Wt 99.8 kg   SpO2 100%   BMI 29.03 kg/m  Gen:   Awake, no distress   Resp:  Normal effort  MSK:   Moves extremities without difficulty  Other:    Medical Decision Making  Medically screening exam initiated at 9:05 AM.  Appropriate orders placed.  Juan Mckenzie was informed that the remainder of the evaluation will be completed by another provider, this initial triage assessment does not replace that evaluation, and the importance of remaining in the ED until their evaluation is complete.     Dorothyann Peng, [place] 09/03/22 [id number]

## 2022-09-03 NOTE — ED Triage Notes (Signed)
Pt. Stated, Ive had chest, back, head pain with I cant hardly breath for the last 2 days.
